# Patient Record
Sex: Female | Born: 1950 | Race: White | Hispanic: No | Marital: Married | Smoking: Never smoker
Health system: Southern US, Community
[De-identification: ages and names within clinical notes are randomized; demographics above are authoritative.]

## PROBLEM LIST (undated history)

## (undated) DIAGNOSIS — R112 Nausea with vomiting, unspecified: Secondary | ICD-10-CM

## (undated) DIAGNOSIS — Z9889 Other specified postprocedural states: Secondary | ICD-10-CM

## (undated) HISTORY — PX: ECTOPIC PREGNANCY SURGERY: SHX613

## (undated) HISTORY — PX: WISDOM TOOTH EXTRACTION: SHX21

## (undated) HISTORY — PX: COLONOSCOPY: SHX174

---

## 2019-05-04 ENCOUNTER — Other Ambulatory Visit: Payer: Self-pay | Admitting: Obstetrics and Gynecology

## 2019-05-04 DIAGNOSIS — R928 Other abnormal and inconclusive findings on diagnostic imaging of breast: Secondary | ICD-10-CM

## 2019-05-16 ENCOUNTER — Other Ambulatory Visit: Payer: Self-pay

## 2019-05-16 ENCOUNTER — Other Ambulatory Visit: Payer: Self-pay | Admitting: Obstetrics and Gynecology

## 2019-05-16 ENCOUNTER — Ambulatory Visit
Admission: RE | Admit: 2019-05-16 | Discharge: 2019-05-16 | Disposition: A | Discharge status: Home / Self Care | Payer: 59 | Source: Ambulatory Visit | Attending: Obstetrics and Gynecology | Admitting: Obstetrics and Gynecology

## 2019-05-16 DIAGNOSIS — R928 Other abnormal and inconclusive findings on diagnostic imaging of breast: Secondary | ICD-10-CM

## 2019-05-19 ENCOUNTER — Ambulatory Visit
Admission: RE | Admit: 2019-05-19 | Discharge: 2019-05-19 | Disposition: A | Discharge status: Home / Self Care | Payer: 59 | Source: Ambulatory Visit | Attending: Obstetrics and Gynecology | Admitting: Obstetrics and Gynecology

## 2019-05-19 ENCOUNTER — Other Ambulatory Visit: Payer: Self-pay

## 2019-05-19 DIAGNOSIS — R928 Other abnormal and inconclusive findings on diagnostic imaging of breast: Secondary | ICD-10-CM

## 2019-05-31 ENCOUNTER — Ambulatory Visit: Payer: Self-pay | Admitting: Surgery

## 2019-05-31 DIAGNOSIS — N6320 Unspecified lump in the left breast, unspecified quadrant: Secondary | ICD-10-CM

## 2019-05-31 NOTE — H&P (Signed)
History of Present Illness Holly Schroeder. Holly Tallman Schroeder; 05/31/2019 5:32 PM) The patient is a 69 year old female who presents with a breast mass. Referred by Dr. Marylynn Schroeder for left breast Mass.  This is a 69 year old female in relatively good health who presents with recent finding on her routine screening mammogram. She had a suspicious finding in the left breast. Further workup showed a 9 x 4 x 8 mm lobular solid cystic mass at 5:00 in the left breast 4 cm from the nipple. The axilla was negative. This mass was biopsied and revealed a finding of complex sclerosing lesion. The patient would like to have this removed.  Menarche age 61 First pregnancy age 65 Breast-feed yes Hormones 3-4 years Menopause age 3 Family history negative   CLINICAL DATA: Patient recalled from screening for left breast mass.  EXAM: DIGITAL DIAGNOSTIC LEFT MAMMOGRAM WITH CAD AND TOMO  ULTRASOUND LEFT BREAST  COMPARISON: Previous exam(s).  ACR Breast Density Category c: The breast tissue is heterogeneously dense, which may obscure small masses.  FINDINGS: Persistent lobular mass within the lower outer left breast demonstrated best on the true lateral and spot compression MLO tomosynthesis images.  Mammographic images were processed with CAD.  Targeted ultrasound is performed, showing a 9 x 4 x 8 mm lobular solid and cystic mass left breast 5 o'clock position 4 cm from nipple corresponding with mammographic abnormality. No left axillary adenopathy.  IMPRESSION: Indeterminate left breast mass 5 o'clock position.  RECOMMENDATION: Ultrasound guided core needle biopsy indeterminate left breast mass 5 o'clock position.  I have discussed the findings and recommendations with the patient. If applicable, a reminder letter will be sent to the patient regarding the next appointment.  BI-RADS CATEGORY 4: Suspicious.   Electronically Signed By: Holly Schroeder M.D. On: 05/16/2019 15:10  CLINICAL  DATA: Patient presents for biopsy of a left breast mass.  EXAM: ULTRASOUND GUIDED LEFT BREAST CORE NEEDLE BIOPSY  COMPARISON: Previous exam(s).  FINDINGS: I met with the patient and we discussed the procedure of ultrasound-guided biopsy, including benefits and alternatives. We discussed the high likelihood of a successful procedure. We discussed the risks of the procedure, including infection, bleeding, tissue injury, clip migration, and inadequate sampling. Informed written consent was given. The usual time-out protocol was performed immediately prior to the procedure.  Lesion quadrant: Lower outer quadrant  Using sterile technique and 1% Lidocaine as local anesthetic, under direct ultrasound visualization, a 14 gauge spring-loaded device was used to perform biopsy of a left breast mass at 5 o'clock using a lateral approach. At the conclusion of the procedure a ribbon tissue marker clip was deployed into the biopsy cavity. Follow up 2 view mammogram was performed and dictated separately.  IMPRESSION: Ultrasound guided biopsy of a left breast mass at 5 o'clock. No apparent complications.  Electronically Signed: By: Holly Schroeder M.D. On: 05/19/2019 13:48   ADDENDUM: Pathology revealed COMPLEX SCLEROSING LESION of the Left breast, 5 o'clock. The biopsy is small epithelial hyperplasia and a focus of architectural atypia. This was found to be concordant by Dr. Audie Schroeder, with excision recommended.  Pathology results were discussed with the patient by telephone. The patient reported doing well after the biopsy with tenderness at the site. Post biopsy instructions and care were reviewed and questions were answered. The patient was encouraged to call [facility] for any additional concerns.  Surgical consultation has been arranged with Dr. Donnie Schroeder at [facility] on May 31, 2019.  Pathology results  reported by  Holly Purser, RN on 05/20/2019.   Electronically Signed By: Holly Schroeder M.D. On: 05/20/2019 13:29    Problem List/Past Medical Holly Schroeder; 05/31/2019 5:32 PM) MASS OF LEFT BREAST ON MAMMOGRAM (N63.20)  Past Surgical History Holly Schroeder, [place]; 05/31/2019 3:01 PM) Breast Biopsy Left. Colon Polyp Removal - Colonoscopy Oral Surgery  Diagnostic Studies History Holly Schroeder, [place]; 05/31/2019 3:01 PM) Colonoscopy 1-5 years ago Mammogram 1-3 years ago  Allergies Holly Schroeder, [place]; 05/31/2019 3:03 PM) No Known Drug Allergies [05/31/2019]: Allergies Reconciled  Medication History Holly Schroeder, CMA; 05/31/2019 3:04 PM) metFORMIN HCl ER (500MG Tablet ER 24HR, Oral) Active. Simvastatin (10MG Tablet, Oral) Active. Prolia (60MG/ML Solution, Subcutaneous) Active. Medications Reconciled  Social History Holly Schroeder, [place]; 05/31/2019 3:01 PM) Alcohol use Moderate alcohol use. Caffeine use Coffee. No drug use Tobacco use Never smoker.  Family History Holly Schroeder, [place]; 05/31/2019 3:01 PM) Alcohol Abuse Daughter, Mother. Depression Mother. Migraine Headache Mother.  Pregnancy / Birth History Holly Schroeder, [place]; 05/31/2019 3:01 PM) Age at menarche 25 years. Age of menopause 50-55 Contraceptive History Oral contraceptives. Length (months) of breastfeeding 7-12 Maternal age 67-30 Para 2  Other Problems Holly Schroeder. Holly Senat, Schroeder; 05/31/2019 5:32 PM) Gastric Ulcer Gastroesophageal Reflux Disease Hypercholesterolemia     Review of Systems Holly Schroeder CMA; 05/31/2019 3:01 PM) General Not Present- Appetite Loss, Chills, Fatigue, Fever, Night Sweats, Weight Gain and Weight Loss. Skin Not Present- Change in Wart/Mole, Dryness, Hives, Jaundice, New Lesions, Non-Healing Wounds, Rash and Ulcer. HEENT Present- Nose Bleed. Not Present- Earache, Hearing Loss, Hoarseness, Oral Ulcers, Ringing in the Ears, Seasonal Allergies, Sinus  Pain, Sore Throat, Visual Disturbances, Wears glasses/contact lenses and Yellow Eyes. Respiratory Not Present- Bloody sputum, Chronic Cough, Difficulty Breathing, Snoring and Wheezing. Breast Present- Breast Mass. Not Present- Breast Pain, Nipple Discharge and Skin Changes. Cardiovascular Not Present- Chest Pain, Difficulty Breathing Lying Down, Leg Cramps, Palpitations, Rapid Heart Rate, Shortness of Breath and Swelling of Extremities. Gastrointestinal Present- Hemorrhoids. Not Present- Abdominal Pain, Bloating, Bloody Stool, Change in Bowel Habits, Chronic diarrhea, Constipation, Difficulty Swallowing, Excessive gas, Gets full quickly at meals, Indigestion, Nausea, Rectal Pain and Vomiting. Female Genitourinary Not Present- Frequency, Nocturia, Painful Urination, Pelvic Pain and Urgency. Musculoskeletal Not Present- Back Pain, Joint Pain, Joint Stiffness, Muscle Pain, Muscle Weakness and Swelling of Extremities. Neurological Not Present- Decreased Memory, Fainting, Headaches, Numbness, Seizures, Tingling, Tremor, Trouble walking and Weakness. Psychiatric Not Present- Anxiety, Bipolar, Change in Sleep Pattern, Depression, Fearful and Frequent crying. Hematology Not Present- Blood Thinners, Easy Bruising, Excessive bleeding, Gland problems, HIV and Persistent Infections.  Vitals Holly Schroeder CMA; 05/31/2019 3:03 PM) 05/31/2019 3:03 PM Weight: 131.8 lb Height: 60in Body Surface Area: 1.56 m Body Mass Index: 25.74 kg/m  Temp.: 59F  Pulse: 96 (Regular)  BP: 130/82 (Sitting, Left Arm, Standard)        Physical Exam Holly Key K. Brayleigh Rybacki Schroeder; 05/31/2019 5:33 PM)  The physical exam findings are as follows: Note:WDWN in NAD Eyes: Pupils equal, round; sclera anicteric HENT: Oral mucosa moist; good dentition Neck: No masses palpated, no thyromegaly Lungs: CTA bilaterally; normal respiratory effort Breasts: symmetric; no nipple retraction or discharge, no axillary lymphadenopathy;  no palpable masses in either breast CV: Regular rate and rhythm; no murmurs; extremities well-perfused with no edema Abd: +bowel sounds, soft, non-tender, no palpable organomegaly; no palpable hernias Skin: Warm, dry; no sign of jaundice Psychiatric - alert and oriented x 4; calm mood and affect    Assessment & Plan Holly Key K. Janille Draughon Schroeder;  05/31/2019 5:33 PM)  MASS OF LEFT BREAST ON MAMMOGRAM (N63.20) Impression: Left breast 0500 4 cmfn - 9 x 4 x 8 mm complex sclerosing lesion with a focus of atypia  Current Plans Schedule for Surgery - Left radioactive seed localized lumpectomy. The surgical procedure has been discussed with the patient. Potential risks, benefits, alternative treatments, and expected outcomes have been explained. All of the patient's questions at this time have been answered. The likelihood of reaching the patient's treatment goal is good. The patient understand the proposed surgical procedure and wishes to proceed.  Holly Schroeder. Georgette Dover, Schroeder, [facility]  General/ Trauma Surgery   05/31/2019 5:34 PM

## 2019-06-02 ENCOUNTER — Other Ambulatory Visit: Payer: Self-pay | Admitting: Surgery

## 2019-06-02 DIAGNOSIS — N6320 Unspecified lump in the left breast, unspecified quadrant: Secondary | ICD-10-CM

## 2019-07-04 ENCOUNTER — Other Ambulatory Visit: Payer: Self-pay

## 2019-07-04 ENCOUNTER — Encounter (HOSPITAL_BASED_OUTPATIENT_CLINIC_OR_DEPARTMENT_OTHER): Payer: Self-pay | Admitting: Surgery

## 2019-07-07 ENCOUNTER — Other Ambulatory Visit: Payer: Self-pay

## 2019-07-07 ENCOUNTER — Encounter (HOSPITAL_BASED_OUTPATIENT_CLINIC_OR_DEPARTMENT_OTHER)
Admission: RE | Admit: 2019-07-07 | Discharge: 2019-07-07 | Disposition: A | Discharge status: Home / Self Care | Payer: Medicare Other | Source: Ambulatory Visit | Attending: Surgery | Admitting: Surgery

## 2019-07-07 DIAGNOSIS — Z79899 Other long term (current) drug therapy: Secondary | ICD-10-CM | POA: Diagnosis not present

## 2019-07-07 DIAGNOSIS — Z01812 Encounter for preprocedural laboratory examination: Secondary | ICD-10-CM | POA: Insufficient documentation

## 2019-07-07 DIAGNOSIS — Z7984 Long term (current) use of oral hypoglycemic drugs: Secondary | ICD-10-CM | POA: Diagnosis not present

## 2019-07-07 DIAGNOSIS — E7800 Pure hypercholesterolemia, unspecified: Secondary | ICD-10-CM | POA: Diagnosis not present

## 2019-07-07 DIAGNOSIS — N6082 Other benign mammary dysplasias of left breast: Secondary | ICD-10-CM | POA: Diagnosis present

## 2019-07-07 LAB — BASIC METABOLIC PANEL
Anion gap: 9 (ref 5–15)
BUN: 13 mg/dL (ref 8–23)
CO2: 26 mmol/L (ref 22–32)
Calcium: 9.5 mg/dL (ref 8.9–10.3)
Chloride: 101 mmol/L (ref 98–111)
Creatinine, Ser: 0.78 mg/dL (ref 0.44–1.00)
GFR calc Af Amer: >60 mL/min (ref >60)
GFR calc non Af Amer: >60 mL/min (ref >60)
Glucose, Bld: 98 mg/dL (ref 70–99)
Potassium: 4.4 mmol/L (ref 3.5–5.1)
Sodium: 136 mmol/L (ref 135–145)

## 2019-07-07 MED ORDER — ENSURE PRE-SURGERY PO LIQD: 296.0000 mL | Freq: Once | ORAL | Status: DC

## 2019-07-07 NOTE — Progress Notes (Signed)

## 2019-07-08 ENCOUNTER — Other Ambulatory Visit (HOSPITAL_COMMUNITY)
Admission: RE | Admit: 2019-07-08 | Discharge: 2019-07-08 | Disposition: A | Discharge status: Home / Self Care | Payer: Medicare Other | Source: Ambulatory Visit | Attending: Surgery | Admitting: Surgery

## 2019-07-08 DIAGNOSIS — Z01812 Encounter for preprocedural laboratory examination: Secondary | ICD-10-CM | POA: Diagnosis present

## 2019-07-08 DIAGNOSIS — Z20822 Contact with and (suspected) exposure to covid-19: Secondary | ICD-10-CM | POA: Insufficient documentation

## 2019-07-08 LAB — SARS CORONAVIRUS 2 (TAT 6-24 HRS): SARS Coronavirus 2: NEGATIVE

## 2019-07-11 ENCOUNTER — Other Ambulatory Visit: Payer: Self-pay

## 2019-07-11 ENCOUNTER — Ambulatory Visit
Admission: RE | Admit: 2019-07-11 | Discharge: 2019-07-11 | Disposition: A | Discharge status: Home / Self Care | Payer: Medicare Other | Source: Ambulatory Visit | Attending: Surgery | Admitting: Surgery

## 2019-07-11 NOTE — Anesthesia Preprocedure Evaluation (Addendum)
Anesthesia Evaluation  Patient identified by MRN, date of birth, ID band Patient awake    Reviewed: Allergy & Precautions, NPO status , Patient's Chart, lab work & pertinent test results  History of Anesthesia Complications (+) PONV  Airway Mallampati: Trach       Dental   Pulmonary    breath sounds clear to auscultation       Cardiovascular negative cardio ROS   Rhythm:Regular Rate:Normal     Neuro/Psych    GI/Hepatic negative GI ROS, Neg liver ROS,   Endo/Other    Renal/GU negative Renal ROS     Musculoskeletal   Abdominal   Peds  Hematology   Anesthesia Other Findings   Reproductive/Obstetrics                            Anesthesia Physical Anesthesia Plan  ASA: II  Anesthesia Plan: General   Post-op Pain Management:    Induction: Intravenous  PONV Risk Score and Plan: 3 and Ondansetron, Dexamethasone and Midazolam  Airway Management Planned: LMA  Additional Equipment:   Intra-op Plan:   Post-operative Plan:   Informed Consent: I have reviewed the patients History and Physical, chart, labs and discussed the procedure including the risks, benefits and alternatives for the proposed anesthesia with the patient or authorized representative who has indicated his/her understanding and acceptance.     Dental advisory given  Plan Discussed with: Anesthesiologist and CRNA  Anesthesia Plan Comments:        Anesthesia Quick Evaluation

## 2019-07-12 ENCOUNTER — Encounter (HOSPITAL_BASED_OUTPATIENT_CLINIC_OR_DEPARTMENT_OTHER): Payer: Self-pay | Admitting: Surgery

## 2019-07-12 ENCOUNTER — Other Ambulatory Visit: Payer: Self-pay

## 2019-07-12 ENCOUNTER — Ambulatory Visit
Admission: RE | Admit: 2019-07-12 | Discharge: 2019-07-12 | Disposition: A | Discharge status: Home / Self Care | Payer: Medicare Other | Source: Ambulatory Visit | Attending: Surgery | Admitting: Surgery

## 2019-07-12 ENCOUNTER — Ambulatory Visit (HOSPITAL_BASED_OUTPATIENT_CLINIC_OR_DEPARTMENT_OTHER): Payer: Medicare Other | Admitting: Anesthesiology

## 2019-07-12 ENCOUNTER — Encounter (HOSPITAL_BASED_OUTPATIENT_CLINIC_OR_DEPARTMENT_OTHER)
Admission: RE | Disposition: A | Discharge status: Home / Self Care | Payer: Self-pay | Source: Home / Self Care | Attending: Surgery

## 2019-07-12 ENCOUNTER — Ambulatory Visit (HOSPITAL_BASED_OUTPATIENT_CLINIC_OR_DEPARTMENT_OTHER)
Admission: RE | Admit: 2019-07-12 | Discharge: 2019-07-12 | Disposition: A | Discharge status: Home / Self Care | Payer: Medicare Other | Attending: Surgery | Admitting: Surgery

## 2019-07-12 DIAGNOSIS — Z79899 Other long term (current) drug therapy: Secondary | ICD-10-CM | POA: Diagnosis not present

## 2019-07-12 DIAGNOSIS — E7800 Pure hypercholesterolemia, unspecified: Secondary | ICD-10-CM | POA: Diagnosis not present

## 2019-07-12 DIAGNOSIS — N6082 Other benign mammary dysplasias of left breast: Secondary | ICD-10-CM | POA: Diagnosis not present

## 2019-07-12 DIAGNOSIS — Z7984 Long term (current) use of oral hypoglycemic drugs: Secondary | ICD-10-CM | POA: Insufficient documentation

## 2019-07-12 DIAGNOSIS — N6320 Unspecified lump in the left breast, unspecified quadrant: Secondary | ICD-10-CM

## 2019-07-12 HISTORY — DX: Other specified postprocedural states: R11.2

## 2019-07-12 HISTORY — PX: BREAST LUMPECTOMY WITH RADIOACTIVE SEED LOCALIZATION: SHX6424

## 2019-07-12 HISTORY — DX: Other specified postprocedural states: Z98.890

## 2019-07-12 LAB — GLUCOSE, CAPILLARY
Glucose-Capillary: 89 mg/dL (ref 70–99)
Glucose-Capillary: 99 mg/dL (ref 70–99)

## 2019-07-12 SURGERY — BREAST LUMPECTOMY WITH RADIOACTIVE SEED LOCALIZATION
Anesthesia: General | Site: Breast | Laterality: Left

## 2019-07-12 MED ORDER — FENTANYL CITRATE (PF) 100 MCG/2ML IJ SOLN: 25.0000 ug | INTRAMUSCULAR | Status: DC | PRN

## 2019-07-12 MED ORDER — HYDROCODONE-ACETAMINOPHEN 5-325 MG PO TABS: 1.0000 | ORAL_TABLET | Freq: Four times a day (QID) | ORAL | 0 refills | Status: DC | PRN

## 2019-07-12 MED ORDER — DEXAMETHASONE SODIUM PHOSPHATE 10 MG/ML IJ SOLN
INTRAMUSCULAR | Status: AC
  Filled 2019-07-12: qty 1

## 2019-07-12 MED ORDER — CHLORHEXIDINE GLUCONATE CLOTH 2 % EX PADS: 6.0000 | MEDICATED_PAD | Freq: Once | CUTANEOUS | Status: DC

## 2019-07-12 MED ORDER — PROPOFOL 500 MG/50ML IV EMUL
INTRAVENOUS | Status: AC
  Filled 2019-07-12: qty 50

## 2019-07-12 MED ORDER — MIDAZOLAM HCL 2 MG/2ML IJ SOLN
1.0000 mg | INTRAMUSCULAR | Status: DC | PRN
  Administered 2019-07-12: 1 mg via INTRAVENOUS

## 2019-07-12 MED ORDER — GABAPENTIN 300 MG PO CAPS
ORAL_CAPSULE | ORAL | Status: AC
  Filled 2019-07-12: qty 1

## 2019-07-12 MED ORDER — GABAPENTIN 300 MG PO CAPS
300.0000 mg | ORAL_CAPSULE | ORAL | Status: AC
  Administered 2019-07-12: 300 mg via ORAL

## 2019-07-12 MED ORDER — ONDANSETRON HCL 4 MG/2ML IJ SOLN
INTRAMUSCULAR | Status: DC | PRN
  Administered 2019-07-12: 4 mg via INTRAVENOUS

## 2019-07-12 MED ORDER — LIDOCAINE 2% (20 MG/ML) 5 ML SYRINGE
INTRAMUSCULAR | Status: DC | PRN
  Administered 2019-07-12: 80 mg via INTRAVENOUS

## 2019-07-12 MED ORDER — CEFAZOLIN SODIUM-DEXTROSE 2-4 GM/100ML-% IV SOLN
INTRAVENOUS | Status: AC
  Filled 2019-07-12: qty 100

## 2019-07-12 MED ORDER — LACTATED RINGERS IV SOLN: INTRAVENOUS | Status: DC

## 2019-07-12 MED ORDER — PROPOFOL 10 MG/ML IV BOLUS
INTRAVENOUS | Status: DC | PRN
  Administered 2019-07-12: 180 mg via INTRAVENOUS

## 2019-07-12 MED ORDER — BUPIVACAINE HCL 0.25 % IJ SOLN
INTRAMUSCULAR | Status: DC | PRN
  Administered 2019-07-12: 10 mL

## 2019-07-12 MED ORDER — DEXAMETHASONE SODIUM PHOSPHATE 10 MG/ML IJ SOLN: 4.0000 mg | INTRAMUSCULAR | Status: DC

## 2019-07-12 MED ORDER — FENTANYL CITRATE (PF) 100 MCG/2ML IJ SOLN
50.0000 ug | INTRAMUSCULAR | Status: DC | PRN
  Administered 2019-07-12: 50 ug via INTRAVENOUS

## 2019-07-12 MED ORDER — BUPIVACAINE HCL (PF) 0.25 % IJ SOLN
INTRAMUSCULAR | Status: AC
  Filled 2019-07-12: qty 60

## 2019-07-12 MED ORDER — EPHEDRINE SULFATE 50 MG/ML IJ SOLN
INTRAMUSCULAR | Status: DC | PRN
  Administered 2019-07-12 (×2): 10 mg via INTRAVENOUS

## 2019-07-12 MED ORDER — BUPIVACAINE HCL (PF) 0.5 % IJ SOLN
INTRAMUSCULAR | Status: AC
  Filled 2019-07-12: qty 30

## 2019-07-12 MED ORDER — FENTANYL CITRATE (PF) 100 MCG/2ML IJ SOLN
INTRAMUSCULAR | Status: AC
  Filled 2019-07-12: qty 2

## 2019-07-12 MED ORDER — MIDAZOLAM HCL 2 MG/2ML IJ SOLN
INTRAMUSCULAR | Status: AC
  Filled 2019-07-12: qty 2

## 2019-07-12 MED ORDER — ONDANSETRON HCL 4 MG/2ML IJ SOLN
INTRAMUSCULAR | Status: AC
  Filled 2019-07-12: qty 2

## 2019-07-12 MED ORDER — ACETAMINOPHEN 500 MG PO TABS
ORAL_TABLET | ORAL | Status: AC
  Filled 2019-07-12: qty 2

## 2019-07-12 MED ORDER — DEXAMETHASONE SODIUM PHOSPHATE 4 MG/ML IJ SOLN
INTRAMUSCULAR | Status: DC | PRN
  Administered 2019-07-12: 5 mg via INTRAVENOUS

## 2019-07-12 MED ORDER — CEFAZOLIN SODIUM-DEXTROSE 2-4 GM/100ML-% IV SOLN
2.0000 g | INTRAVENOUS | Status: AC
  Administered 2019-07-12: 2 g via INTRAVENOUS

## 2019-07-12 MED ORDER — ACETAMINOPHEN 500 MG PO TABS
1000.0000 mg | ORAL_TABLET | ORAL | Status: AC
  Administered 2019-07-12: 1000 mg via ORAL

## 2019-07-12 SURGICAL SUPPLY — 54 items
APL PRP STRL LF DISP 70% ISPRP (MISCELLANEOUS) ×1
APL SKNCLS STERI-STRIP NONHPOA (GAUZE/BANDAGES/DRESSINGS) ×1
APPLIER CLIP 9.375 MED OPEN (MISCELLANEOUS) ×2
APR CLP MED 9.3 20 MLT OPN (MISCELLANEOUS) ×1
BENZOIN TINCTURE PRP APPL 2/3 (GAUZE/BANDAGES/DRESSINGS) ×2 IMPLANT
BLADE HEX COATED 2.75 (ELECTRODE) ×2 IMPLANT
BLADE SURG 15 STRL LF DISP TIS (BLADE) ×1 IMPLANT
BLADE SURG 15 STRL SS (BLADE) ×2
CANISTER SUC SOCK COL 7IN (MISCELLANEOUS) IMPLANT
CANISTER SUCT 1200ML W/VALVE (MISCELLANEOUS) IMPLANT
CHLORAPREP W/TINT 26 (MISCELLANEOUS) ×2 IMPLANT
CLIP APPLIE 9.375 MED OPEN (MISCELLANEOUS) ×1 IMPLANT
COVER BACK TABLE 60X90IN (DRAPES) ×2 IMPLANT
COVER MAYO STAND STRL (DRAPES) ×2 IMPLANT
COVER PROBE W GEL 5X96 (DRAPES) ×2 IMPLANT
COVER WAND RF STERILE (DRAPES) IMPLANT
DECANTER SPIKE VIAL GLASS SM (MISCELLANEOUS) IMPLANT
DRAPE LAPAROTOMY 100X72 PEDS (DRAPES) ×2 IMPLANT
DRAPE UTILITY XL STRL (DRAPES) ×2 IMPLANT
DRSG TEGADERM 4X4.75 (GAUZE/BANDAGES/DRESSINGS) ×2 IMPLANT
ELECT REM PT RETURN 9FT ADLT (ELECTROSURGICAL) ×2
ELECTRODE REM PT RTRN 9FT ADLT (ELECTROSURGICAL) ×1 IMPLANT
GAUZE SPONGE 4X4 12PLY STRL LF (GAUZE/BANDAGES/DRESSINGS) IMPLANT
GLOVE BIO SURGEON STRL SZ 6.5 (GLOVE) ×1 IMPLANT
GLOVE BIO SURGEON STRL SZ7 (GLOVE) ×2 IMPLANT
GLOVE BIOGEL PI IND STRL 7.0 (GLOVE) IMPLANT
GLOVE BIOGEL PI IND STRL 7.5 (GLOVE) ×1 IMPLANT
GLOVE BIOGEL PI INDICATOR 7.0 (GLOVE) ×2
GLOVE BIOGEL PI INDICATOR 7.5 (GLOVE) ×1
GOWN STRL REUS W/ TWL LRG LVL3 (GOWN DISPOSABLE) ×2 IMPLANT
GOWN STRL REUS W/TWL LRG LVL3 (GOWN DISPOSABLE) ×4
ILLUMINATOR WAVEGUIDE N/F (MISCELLANEOUS) IMPLANT
KIT MARKER MARGIN INK (KITS) ×2 IMPLANT
LIGHT WAVEGUIDE WIDE FLAT (MISCELLANEOUS) IMPLANT
NDL HYPO 25X1 1.5 SAFETY (NEEDLE) ×1 IMPLANT
NEEDLE HYPO 25X1 1.5 SAFETY (NEEDLE) ×2 IMPLANT
NS IRRIG 1000ML POUR BTL (IV SOLUTION) ×2 IMPLANT
PACK BASIN DAY SURGERY FS (CUSTOM PROCEDURE TRAY) ×2 IMPLANT
PENCIL SMOKE EVACUATOR (MISCELLANEOUS) ×2 IMPLANT
SLEEVE SCD COMPRESS KNEE MED (MISCELLANEOUS) ×2 IMPLANT
SPONGE GAUZE 2X2 8PLY STRL LF (GAUZE/BANDAGES/DRESSINGS) ×1 IMPLANT
SPONGE LAP 18X18 RF (DISPOSABLE) IMPLANT
SPONGE LAP 4X18 RFD (DISPOSABLE) ×2 IMPLANT
STRIP CLOSURE SKIN 1/2X4 (GAUZE/BANDAGES/DRESSINGS) ×2 IMPLANT
SUT MON AB 4-0 PC3 18 (SUTURE) ×2 IMPLANT
SUT SILK 2 0 SH (SUTURE) IMPLANT
SUT VIC AB 3-0 SH 27 (SUTURE) ×2
SUT VIC AB 3-0 SH 27X BRD (SUTURE) ×1 IMPLANT
SYR BULB 3OZ (MISCELLANEOUS) IMPLANT
SYR CONTROL 10ML LL (SYRINGE) ×2 IMPLANT
TOWEL GREEN STERILE FF (TOWEL DISPOSABLE) ×2 IMPLANT
TRAY FAXITRON CT DISP (TRAY / TRAY PROCEDURE) ×2 IMPLANT
TUBE CONNECTING 20X1/4 (TUBING) IMPLANT
YANKAUER SUCT BULB TIP NO VENT (SUCTIONS) IMPLANT

## 2019-07-12 NOTE — Discharge Instructions (Signed)
[place] Office Phone Number [phone]  BREAST BIOPSY/ PARTIAL MASTECTOMY: POST OP INSTRUCTIONS  Always review your discharge instruction sheet given to you by the facility where your surgery was performed.  IF YOU HAVE DISABILITY OR FAMILY LEAVE FORMS, YOU MUST BRING THEM TO THE OFFICE FOR PROCESSING.  DO NOT GIVE THEM TO YOUR DOCTOR.  1. A prescription for pain medication may be given to you upon discharge.  Take your pain medication as prescribed, if needed.  If narcotic pain medicine is not needed, then you may take acetaminophen (Tylenol) or ibuprofen (Advil) as needed. 2. Take your usually prescribed medications unless otherwise directed 3. If you need a refill on your pain medication, please contact your pharmacy.  They will contact our office to request authorization.  Prescriptions will not be filled after 5pm or on week-ends. 4. You should eat very light the first 24 hours after surgery, such as soup, crackers, pudding, etc.  Resume your normal diet the day after surgery. 5. Most patients will experience some swelling and bruising in the breast.  Ice packs and a good support bra will help.  Swelling and bruising can take several days to resolve.  6. It is common to experience some constipation if taking pain medication after surgery.  Increasing fluid intake and taking a stool softener will usually help or prevent this problem from occurring.  A mild laxative (Milk of Magnesia or Miralax) should be taken according to package directions if there are no bowel movements after 48 hours. 7. Unless discharge instructions indicate otherwise, you may remove your bandages 48 hours after surgery, and you may shower at that time.  You will have steri-strips (small skin tapes) in place directly over the incision.  These strips should be left on the skin for 7-10 days.   Any sutures or staples will be removed at the office during your follow-up visit. 8. ACTIVITIES:  You may resume  regular daily activities (gradually increasing) beginning the next day.  Wearing a good support bra or sports bra minimizes pain and swelling.  You may have sexual intercourse when it is comfortable. a. You may drive when you no longer are taking prescription pain medication, you can comfortably wear a seatbelt, and you can safely maneuver your car and apply brakes. b. RETURN TO WORK:  1-2 weeks 9. You should see your doctor in the office for a follow-up appointment approximately two weeks after your surgery.  Your doctor's nurse will typically make your follow-up appointment when she calls you with your pathology report.  Expect your pathology report 2-3 business days after your surgery.  You may call to check if you do not hear from [place] after three days. 10. OTHER INSTRUCTIONS: _______________________________________________________________________________________________ _____________________________________________________________________________________________________________________________________ _____________________________________________________________________________________________________________________________________ _____________________________________________________________________________________________________________________________________  WHEN TO CALL YOUR DOCTOR: 1. Fever over 101.0 2. Nausea and/or vomiting. 3. Extreme swelling or bruising. 4. Continued bleeding from incision. 5. Increased pain, redness, or drainage from the incision.  The clinic staff is available to answer your questions during regular business hours.  Please don't hesitate to call and ask to speak to one of the nurses for clinical concerns.  If you have a medical emergency, go to the nearest emergency room or call 911.  A surgeon from [facility] is always on call at the hospital.  For further questions, please visit [email or web address]  \\    NO TYLENOL PRODUCTS UNTIL 1:00  PM     Post Anesthesia Home Care Instructions  Activity: Get plenty of rest  for the remainder of the day. A responsible individual must stay with you for 24 hours following the procedure.  For the next 24 hours, DO NOT: -Drive a car -Advertising copywriter -Drink alcoholic beverages -Take any medication unless instructed by your physician -Make any legal decisions or sign important papers.  Meals: Start with liquid foods such as gelatin or soup. Progress to regular foods as tolerated. Avoid greasy, spicy, heavy foods. If nausea and/or vomiting occur, drink only clear liquids until the nausea and/or vomiting subsides. Call your physician if vomiting continues.  Special Instructions/Symptoms: Your throat may feel dry or sore from the anesthesia or the breathing tube placed in your throat during surgery. If this causes discomfort, gargle with warm salt water. The discomfort should disappear within 24 hours.  If you had a scopolamine patch placed behind your ear for the management of post- operative nausea and/or vomiting:  1. The medication in the patch is effective for 72 hours, after which it should be removed.  Wrap patch in a tissue and discard in the trash. Wash hands thoroughly with soap and water. 2. You may remove the patch earlier than 72 hours if you experience unpleasant side effects which may include dry mouth, dizziness or visual disturbances. 3. Avoid touching the patch. Wash your hands with soap and water after contact with the patch.

## 2019-07-12 NOTE — Anesthesia Postprocedure Evaluation (Signed)
Anesthesia Post Note  Patient: Transport planner  Procedure(s) Performed: LEFT BREAST LUMPECTOMY WITH RADIOACTIVE SEED LOCALIZATION (Left Breast)     Patient location during evaluation: PACU Anesthesia Type: General Level of consciousness: awake Pain management: pain level controlled Vital Signs Assessment: post-procedure vital signs reviewed and stable Respiratory status: spontaneous breathing Cardiovascular status: stable Postop Assessment: no apparent nausea or vomiting Anesthetic complications: no    Last Vitals:  Vitals:   07/12/19 0850 07/12/19 0930  BP:  136/77  Pulse: 86 82  Resp: 14 16  Temp:  36.5 C  SpO2: 99% 100%    Last Pain:  Vitals:   07/12/19 0930  TempSrc:   PainSc: 2                  Alban Marucci

## 2019-07-12 NOTE — Op Note (Signed)
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 Anesthesia:  GEN - LMA Indications:  This is a 69 year old female in relatively good health who presents with recent finding on her routine screening mammogram. She had a suspicious finding in the left breast. Further workup showed a 9 x 4 x 8 mm lobular solid cystic mass at 5:00 in the left breast 4 cm from the nipple. The axilla was negative. This mass was biopsied and revealed a finding of complex sclerosing lesion.  Description of procedure: The patient is brought to the operating room placed in supine position on the operating room table. After an adequate level of general anesthesia was obtained, her left breast was prepped with ChloraPrep and draped in sterile fashion. A timeout was taken to ensure the proper patient and proper procedure. We interrogated the breast with the neoprobe. We made a circumareolar incision around the lower side of the nipple after infiltrating with 0.25% Marcaine. Dissection was carried down in the breast tissue with cautery. We used the neoprobe to guide [place] towards the radioactive seed. We excised an area of tissue around the radioactive seed 1.5 cm in diameter. The specimen was removed and was oriented with a paint kit. Specimen mammogram showed the radioactive seed as well as the biopsy clip within the specimen. This was sent for pathologic examination. There is no residual radioactivity within the biopsy cavity. We inspected carefully for hemostasis. The wound was thoroughly irrigated. We placed clips to mark the margins of the lumpectomy cavity. The wound was closed with a deep layer of 3-0 Vicryl and a subcuticular layer of 4-0 Monocryl. Benzoin Steri-Strips were applied. The patient was then extubated and brought to the recovery room in stable condition. All sponge, instrument, and  needle counts are correct.  Holly Schroeder. Holly Dover, MD, [facility]  General/ Trauma Surgery  07/12/2019 8:16 AM

## 2019-07-12 NOTE — Anesthesia Procedure Notes (Signed)
Procedure Name: LMA Insertion Date/Time: 07/12/2019 7:29 AM Performed by: Burna Cash, CRNA Pre-anesthesia Checklist: Patient identified, Emergency Drugs available, Suction available and Patient being monitored Patient Re-evaluated:Patient Re-evaluated prior to induction Oxygen Delivery Method: Circle system utilized Preoxygenation: Pre-oxygenation with 100% oxygen Induction Type: IV induction Ventilation: Mask ventilation without difficulty LMA: LMA inserted LMA Size: 4.0 Number of attempts: 1 Airway Equipment and Method: Bite block Placement Confirmation: positive ETCO2 Tube secured with: Tape Dental Injury: Teeth and Oropharynx as per pre-operative assessment

## 2019-07-12 NOTE — H&P (Signed)
History of Present Illness  The patient is a 69 year old female who presents with a breast mass. Referred by Dr. Marylynn Pearson for left breast Mass.  This is a 69 year old female in relatively good health who presents with recent finding on her routine screening mammogram. She had a suspicious finding in the left breast. Further workup showed a 9 x 4 x 8 mm lobular solid cystic mass at 5:00 in the left breast 4 cm from the nipple. The axilla was negative. This mass was biopsied and revealed a finding of complex sclerosing lesion. The patient would like to have this removed.  Menarche age 39 First pregnancy age 48 Breast-feed yes Hormones 3-4 years Menopause age 38 Family history negative   CLINICAL DATA: Patient recalled from screening for left breast mass.  EXAM: DIGITAL DIAGNOSTIC LEFT MAMMOGRAM WITH CAD AND TOMO  ULTRASOUND LEFT BREAST  COMPARISON: Previous exam(s).  ACR Breast Density Category c: The breast tissue is heterogeneously dense, which may obscure small masses.  FINDINGS: Persistent lobular mass within the lower outer left breast demonstrated best on the true lateral and spot compression MLO tomosynthesis images.  Mammographic images were processed with CAD.  Targeted ultrasound is performed, showing a 9 x 4 x 8 mm lobular solid and cystic mass left breast 5 o'clock position 4 cm from nipple corresponding with mammographic abnormality. No left axillary adenopathy.  IMPRESSION: Indeterminate left breast mass 5 o'clock position.  RECOMMENDATION: Ultrasound guided core needle biopsy indeterminate left breast mass 5 o'clock position.  I have discussed the findings and recommendations with the patient. If applicable, a reminder letter will be sent to the patient regarding the next appointment.  BI-RADS CATEGORY 4: Suspicious.   Electronically Signed By: Lovey Newcomer M.D. On: 05/16/2019 15:10  CLINICAL DATA: Patient  presents for biopsy of a left breast mass.  EXAM: ULTRASOUND GUIDED LEFT BREAST CORE NEEDLE BIOPSY  COMPARISON: Previous exam(s).  FINDINGS: I met with the patient and we discussed the procedure of ultrasound-guided biopsy, including benefits and alternatives. We discussed the high likelihood of a successful procedure. We discussed the risks of the procedure, including infection, bleeding, tissue injury, clip migration, and inadequate sampling. Informed written consent was given. The usual time-out protocol was performed immediately prior to the procedure.  Lesion quadrant: Lower outer quadrant  Using sterile technique and 1% Lidocaine as local anesthetic, under direct ultrasound visualization, a 14 gauge spring-loaded device was used to perform biopsy of a left breast mass at 5 o'clock using a lateral approach. At the conclusion of the procedure a ribbon tissue marker clip was deployed into the biopsy cavity. Follow up 2 view mammogram was performed and dictated separately.  IMPRESSION: Ultrasound guided biopsy of a left breast mass at 5 o'clock. No apparent complications.  Electronically Signed: By: Audie Pinto M.D. On: 05/19/2019 13:48   ADDENDUM: Pathology revealed COMPLEX SCLEROSING LESION of the Left breast, 5 o'clock. The biopsy is small epithelial hyperplasia and a focus of architectural atypia. This was found to be concordant by Dr. Audie Pinto, with excision recommended.  Pathology results were discussed with the patient by telephone. The patient reported doing well after the biopsy with tenderness at the site. Post biopsy instructions and care were reviewed and questions were answered. The patient was encouraged to call [facility] for any additional concerns.  Surgical consultation has been arranged with Dr. Donnie Mesa at [facility] on May 31, 2019.  Pathology results reported by  Terie Purser, RN  on 05/20/2019.   Electronically Signed By: Audie Pinto M.D. On: 05/20/2019 13:29    Problem List/Past Medical  MASS OF LEFT BREAST ON MAMMOGRAM (N63.20)  Past Surgical History  Breast Biopsy Left. Colon Polyp Removal - Colonoscopy Oral Surgery  Diagnostic Studies History Colonoscopy 1-5 years ago Mammogram 1-3 years ago  Allergies  No Known Drug Allergies  Allergies Reconciled  Medication History  metFORMIN HCl ER ('500MG'$  Tablet ER 24HR, Oral) Active. Simvastatin ('10MG'$  Tablet, Oral) Active. Prolia ('60MG'$ /ML Solution, Subcutaneous) Active. Medications Reconciled  Social History  Alcohol use Moderate alcohol use. Caffeine use Coffee. No drug use Tobacco use Never smoker.  Family History  Alcohol Abuse Daughter, Mother. Depression Mother. Migraine Headache Mother.  Pregnancy / Birth History  Age at menarche 2 years. Age of menopause 51-55 Contraceptive History Oral contraceptives. Length (months) of breastfeeding 7-12 Maternal age 56-30 Para 2  Other Problems  Gastric Ulcer Gastroesophageal Reflux Disease Hypercholesterolemia   Review of Systems  General Not Present- Appetite Loss, Chills, Fatigue, Fever, Night Sweats, Weight Gain and Weight Loss. Skin Not Present- Change in Wart/Mole, Dryness, Hives, Jaundice, New Lesions, Non-Healing Wounds, Rash and Ulcer. HEENT Present- Nose Bleed. Not Present- Earache, Hearing Loss, Hoarseness, Oral Ulcers, Ringing in the Ears, Seasonal Allergies, Sinus Pain, Sore Throat, Visual Disturbances, Wears glasses/contact lenses and Yellow Eyes. Respiratory Not Present- Bloody sputum, Chronic Cough, Difficulty Breathing, Snoring and Wheezing. Breast Present- Breast Mass. Not Present- Breast Pain, Nipple Discharge and Skin Changes. Cardiovascular Not Present- Chest Pain, Difficulty Breathing Lying Down, Leg Cramps, Palpitations, Rapid Heart Rate, Shortness of Breath  and Swelling of Extremities. Gastrointestinal Present- Hemorrhoids. Not Present- Abdominal Pain, Bloating, Bloody Stool, Change in Bowel Habits, Chronic diarrhea, Constipation, Difficulty Swallowing, Excessive gas, Gets full quickly at meals, Indigestion, Nausea, Rectal Pain and Vomiting. Female Genitourinary Not Present- Frequency, Nocturia, Painful Urination, Pelvic Pain and Urgency. Musculoskeletal Not Present- Back Pain, Joint Pain, Joint Stiffness, Muscle Pain, Muscle Weakness and Swelling of Extremities. Neurological Not Present- Decreased Memory, Fainting, Headaches, Numbness, Seizures, Tingling, Tremor, Trouble walking and Weakness. Psychiatric Not Present- Anxiety, Bipolar, Change in Sleep Pattern, Depression, Fearful and Frequent crying. Hematology Not Present- Blood Thinners, Easy Bruising, Excessive bleeding, Gland problems, HIV and Persistent Infections.  Vitals Weight: 131.8 lb Height: 60in Body Surface Area: 1.56 m Body Mass Index: 25.74 kg/m  Temp.: 76F  Pulse: 96 (Regular)  BP: 130/82 (Sitting, Left Arm, Standard)   The physical exam findings are as follows: Note:WDWN in NAD Eyes: Pupils equal, round; sclera anicteric HENT: Oral mucosa moist; good dentition Neck: No masses palpated, no thyromegaly Lungs: CTA bilaterally; normal respiratory effort Breasts: symmetric; no nipple retraction or discharge, no axillary lymphadenopathy; no palpable masses in either breast CV: Regular rate and rhythm; no murmurs; extremities well-perfused with no edema Abd: +bowel sounds, soft, non-tender, no palpable organomegaly; no palpable hernias Skin: Warm, dry; no sign of jaundice Psychiatric - alert and oriented x 4; calm mood and affect    Assessment & Plan  MASS OF LEFT BREAST ON MAMMOGRAM (N63.20) Impression: Left breast 0500 4 cmfn - 9 x 4 x 8 mm complex sclerosing lesion with a focus of atypia  Current Plans Schedule for Surgery - Left radioactive seed  localized lumpectomy. The surgical procedure has been discussed with the patient. Potential risks, benefits, alternative treatments, and expected outcomes have been explained. All of the patient's questions at this time have been answered. The likelihood of reaching the patient's treatment goal is good. The patient understand the proposed surgical procedure  and wishes to proceed.   Imogene Burn. Georgette Dover, MD, [facility]  General/ Trauma Surgery   07/12/2019 7:12 AM

## 2019-07-12 NOTE — Transfer of Care (Signed)
Immediate Anesthesia Transfer of Care Note  Patient: Holly Schroeder  Procedure(s) Performed: LEFT BREAST LUMPECTOMY WITH RADIOACTIVE SEED LOCALIZATION (Left Breast)  Patient Location: PACU  Anesthesia Type:General  Level of Consciousness: sedated  Airway & Oxygen Therapy: Patient Spontanous Breathing and Patient connected to nasal cannula oxygen  Post-op Assessment: Report given to RN and Post -op Vital signs reviewed and stable  Post vital signs: Reviewed and stable  Last Vitals:  Vitals Value Taken Time  BP 125/70 07/12/19 0823  Temp    Pulse 116 07/12/19 0826  Resp 13 07/12/19 0826  SpO2 100 % 07/12/19 0826  Vitals shown include unvalidated device data.  Last Pain:  Vitals:   07/12/19 0649  TempSrc: Tympanic  PainSc: 0-No pain         Complications: No apparent anesthesia complications

## 2019-07-14 LAB — SURGICAL PATHOLOGY

## 2020-02-28 ENCOUNTER — Encounter (HOSPITAL_COMMUNITY): Payer: Self-pay

## 2021-06-02 IMAGING — MG MM BREAST LOCALIZATION CLIP
4 series · 4 of 12 positions shown · non-contrast
Comparison: Previous exam(s).

CLINICAL DATA: Patient underwent ultrasound-guided biopsy of a left
breast mass.

EXAM:
DIAGNOSTIC LEFT MAMMOGRAM POST ULTRASOUND BIOPSY

[L ML synth-2D]
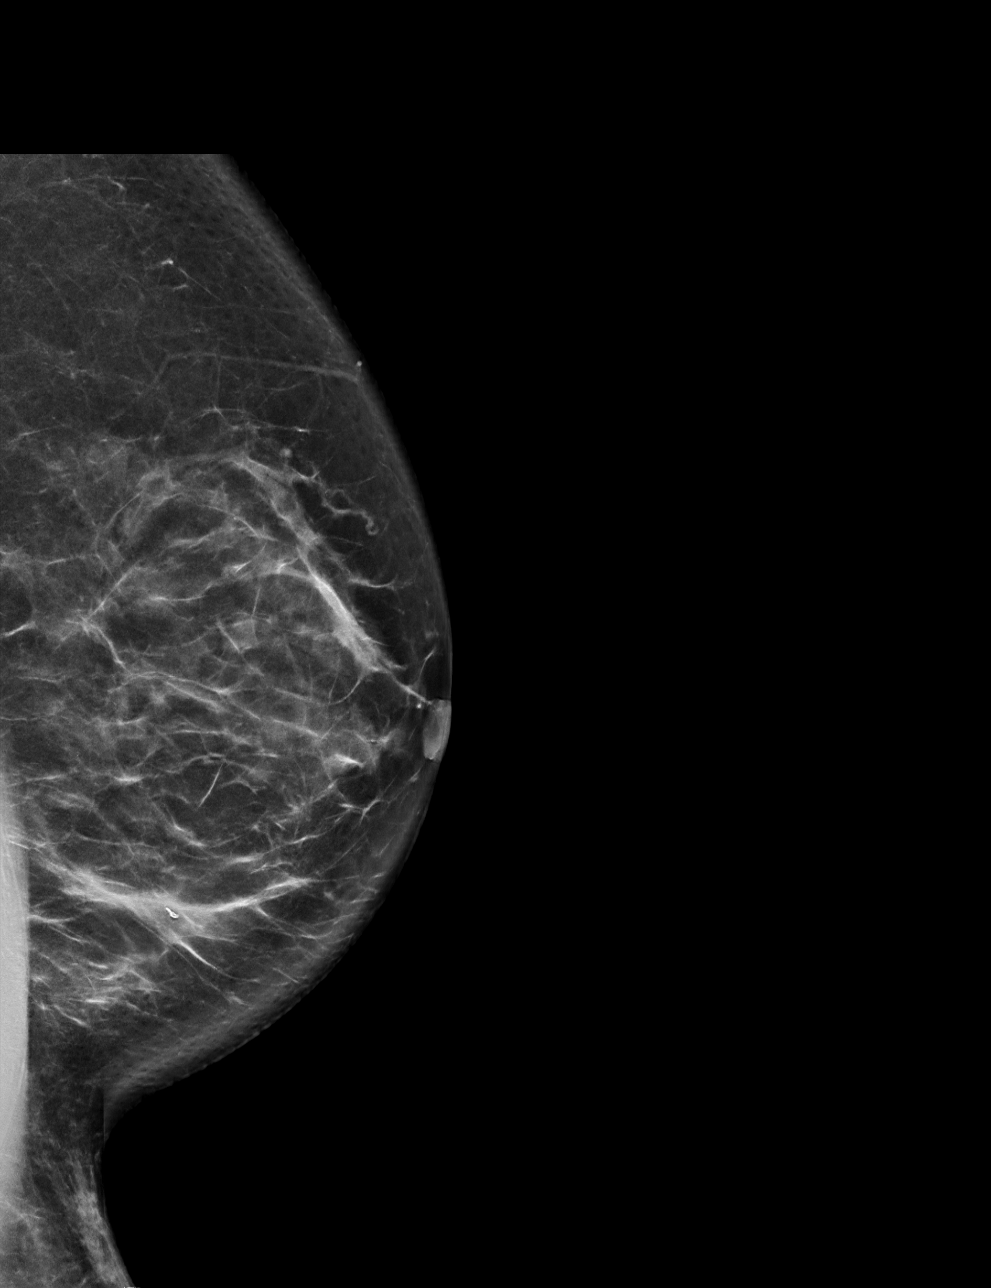

[L CC synth-2D]
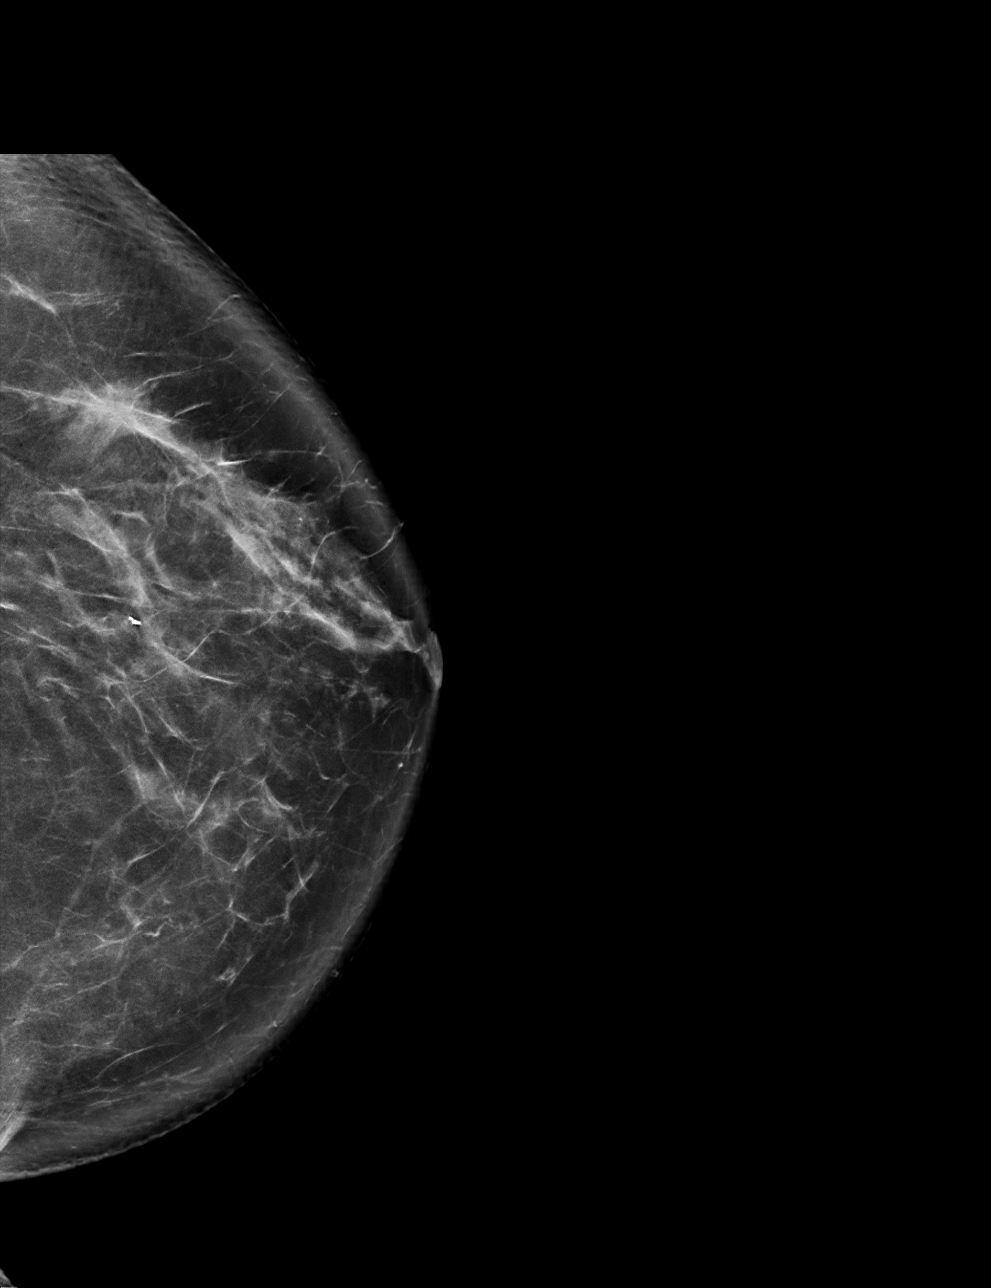

[L CC tomo · tomo slice 43/84.0]
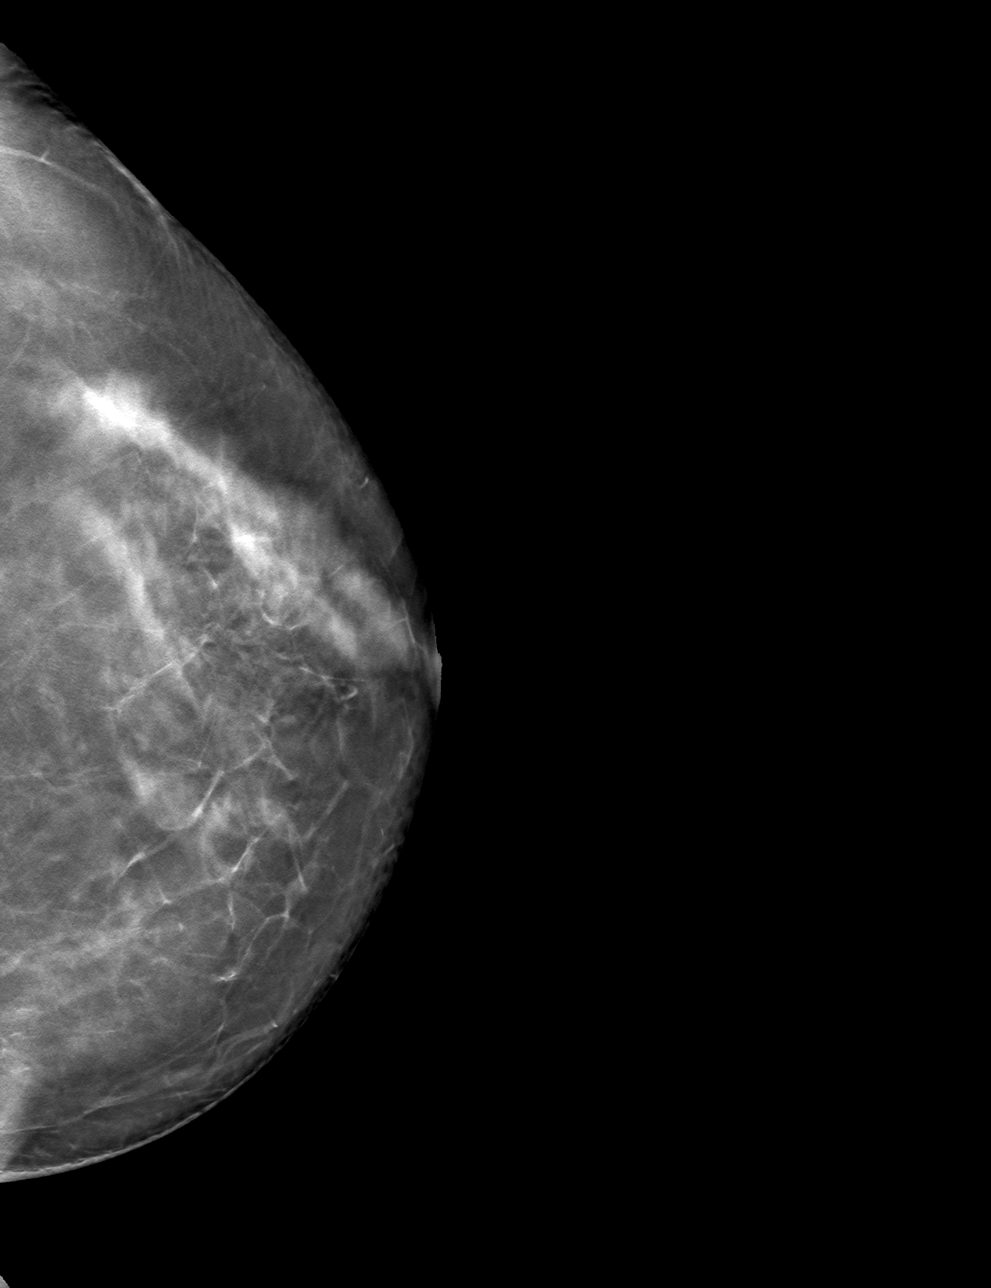

[L ML tomo · tomo slice 39/76.0]
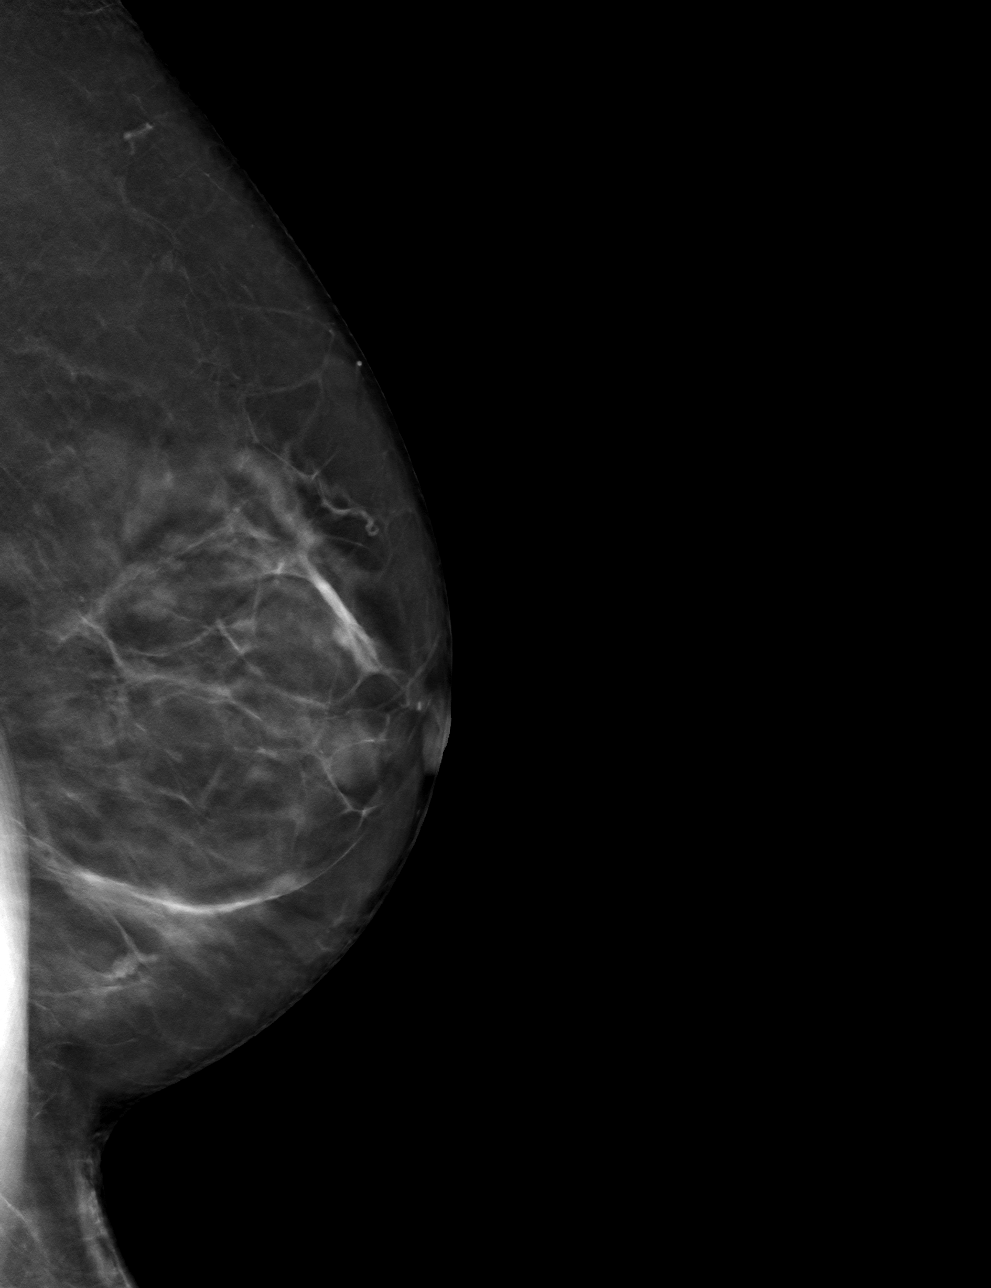

[4 of 12 positions shown; findings below may reference images not displayed]

FINDINGS: Mammographic images were obtained following ultrasound guided biopsy
of a left breast mass at 5 o'clock. The biopsy marking clip is in
expected position at the site of biopsy.
IMPRESSION: Appropriate positioning of the ribbon shaped biopsy marking clip at
the site of biopsy in the left breast 5 o'clock.

Final Assessment: Post Procedure Mammograms for Marker Placement

## 2021-07-26 IMAGING — DX MM BREAST SURGICAL SPECIMEN
1 series · 2 of 2 positions shown · non-contrast
Comparison: Previous exam(s).

CLINICAL DATA: Post left breast excision for a complex sclerosing
lesion.

EXAM:
SPECIMEN RADIOGRAPH OF THE LEFT BREAST

[Series 2: specimen digital x-ray, derived · left · 0.10mm/px · 2 of 2 slices shown]
[im 1/2]
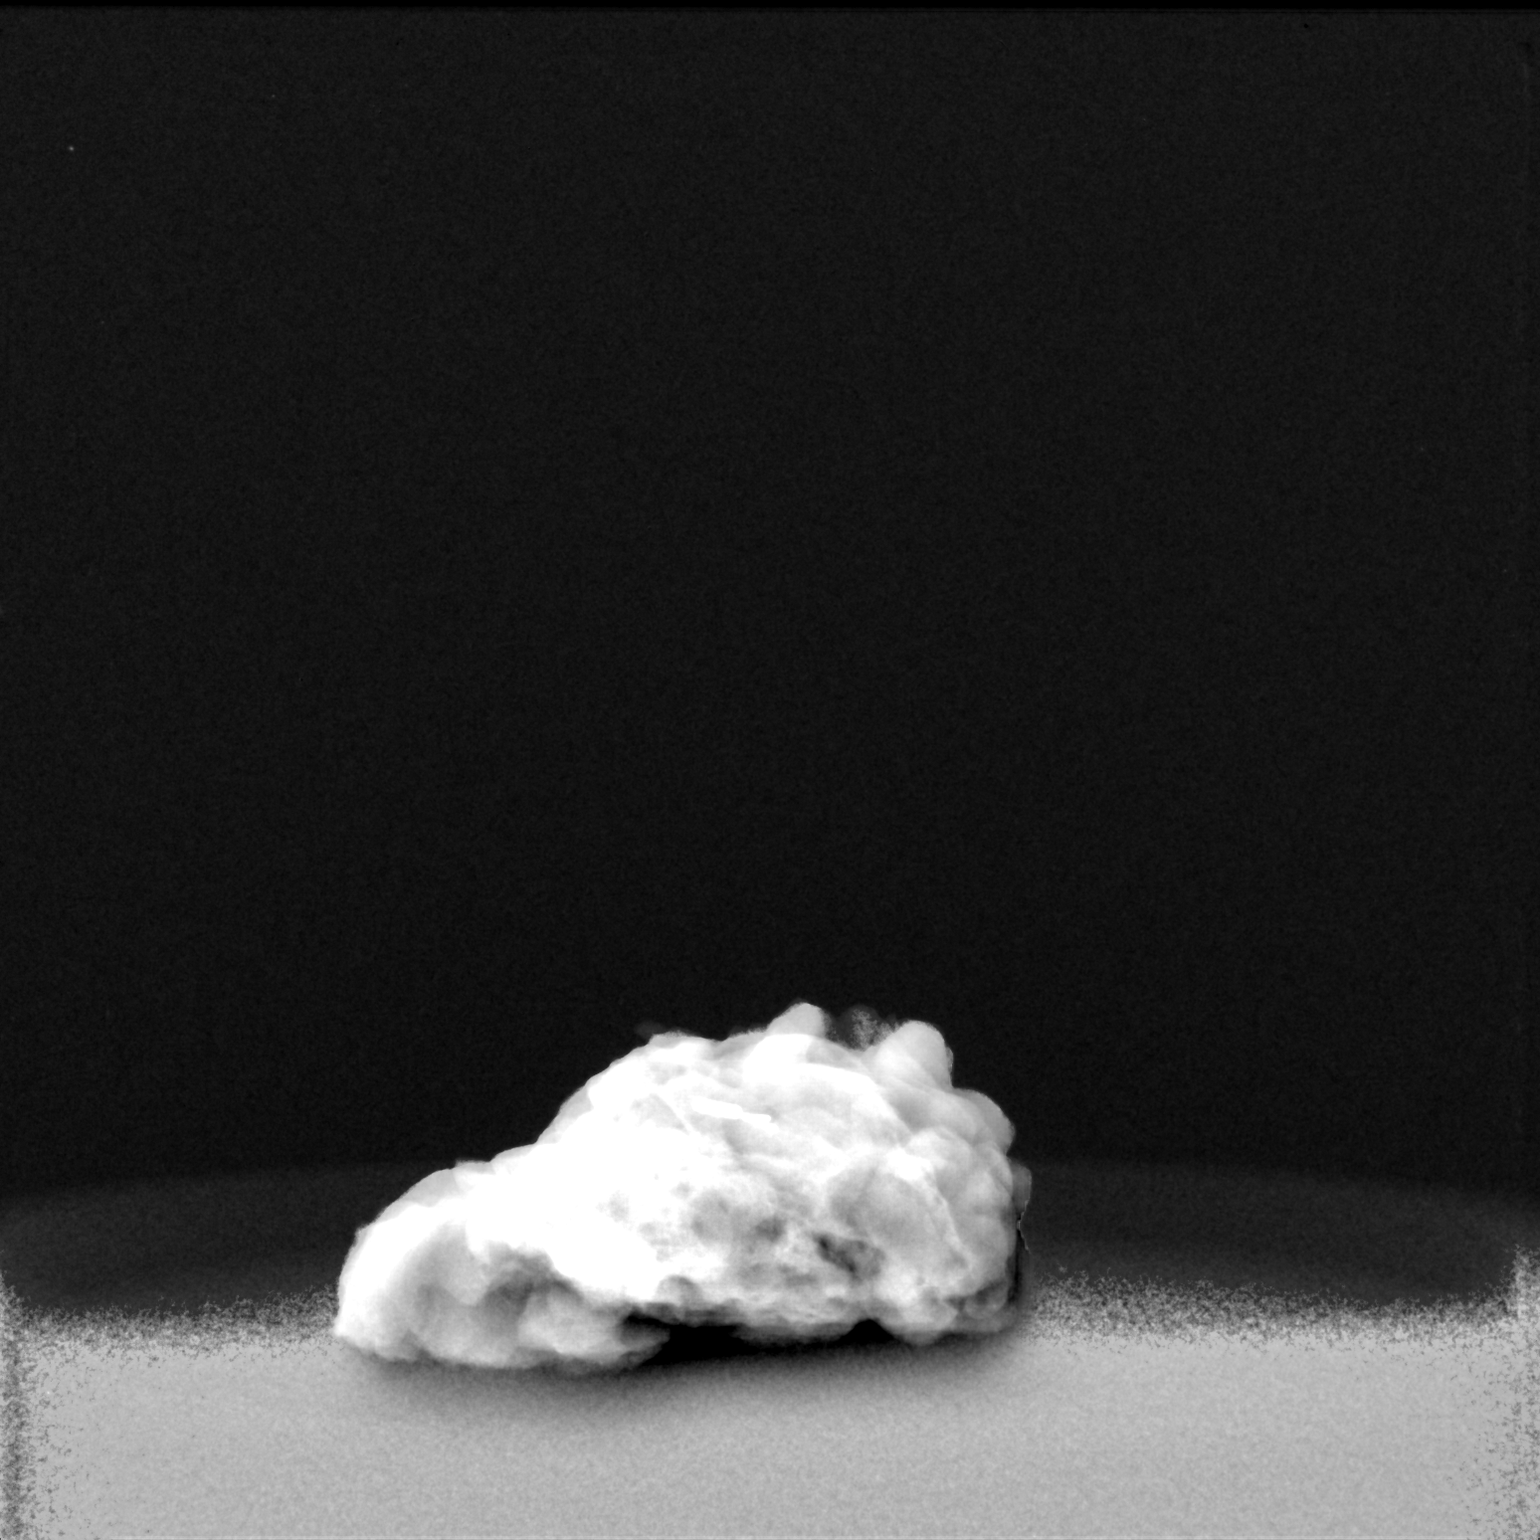
[im 2/2]
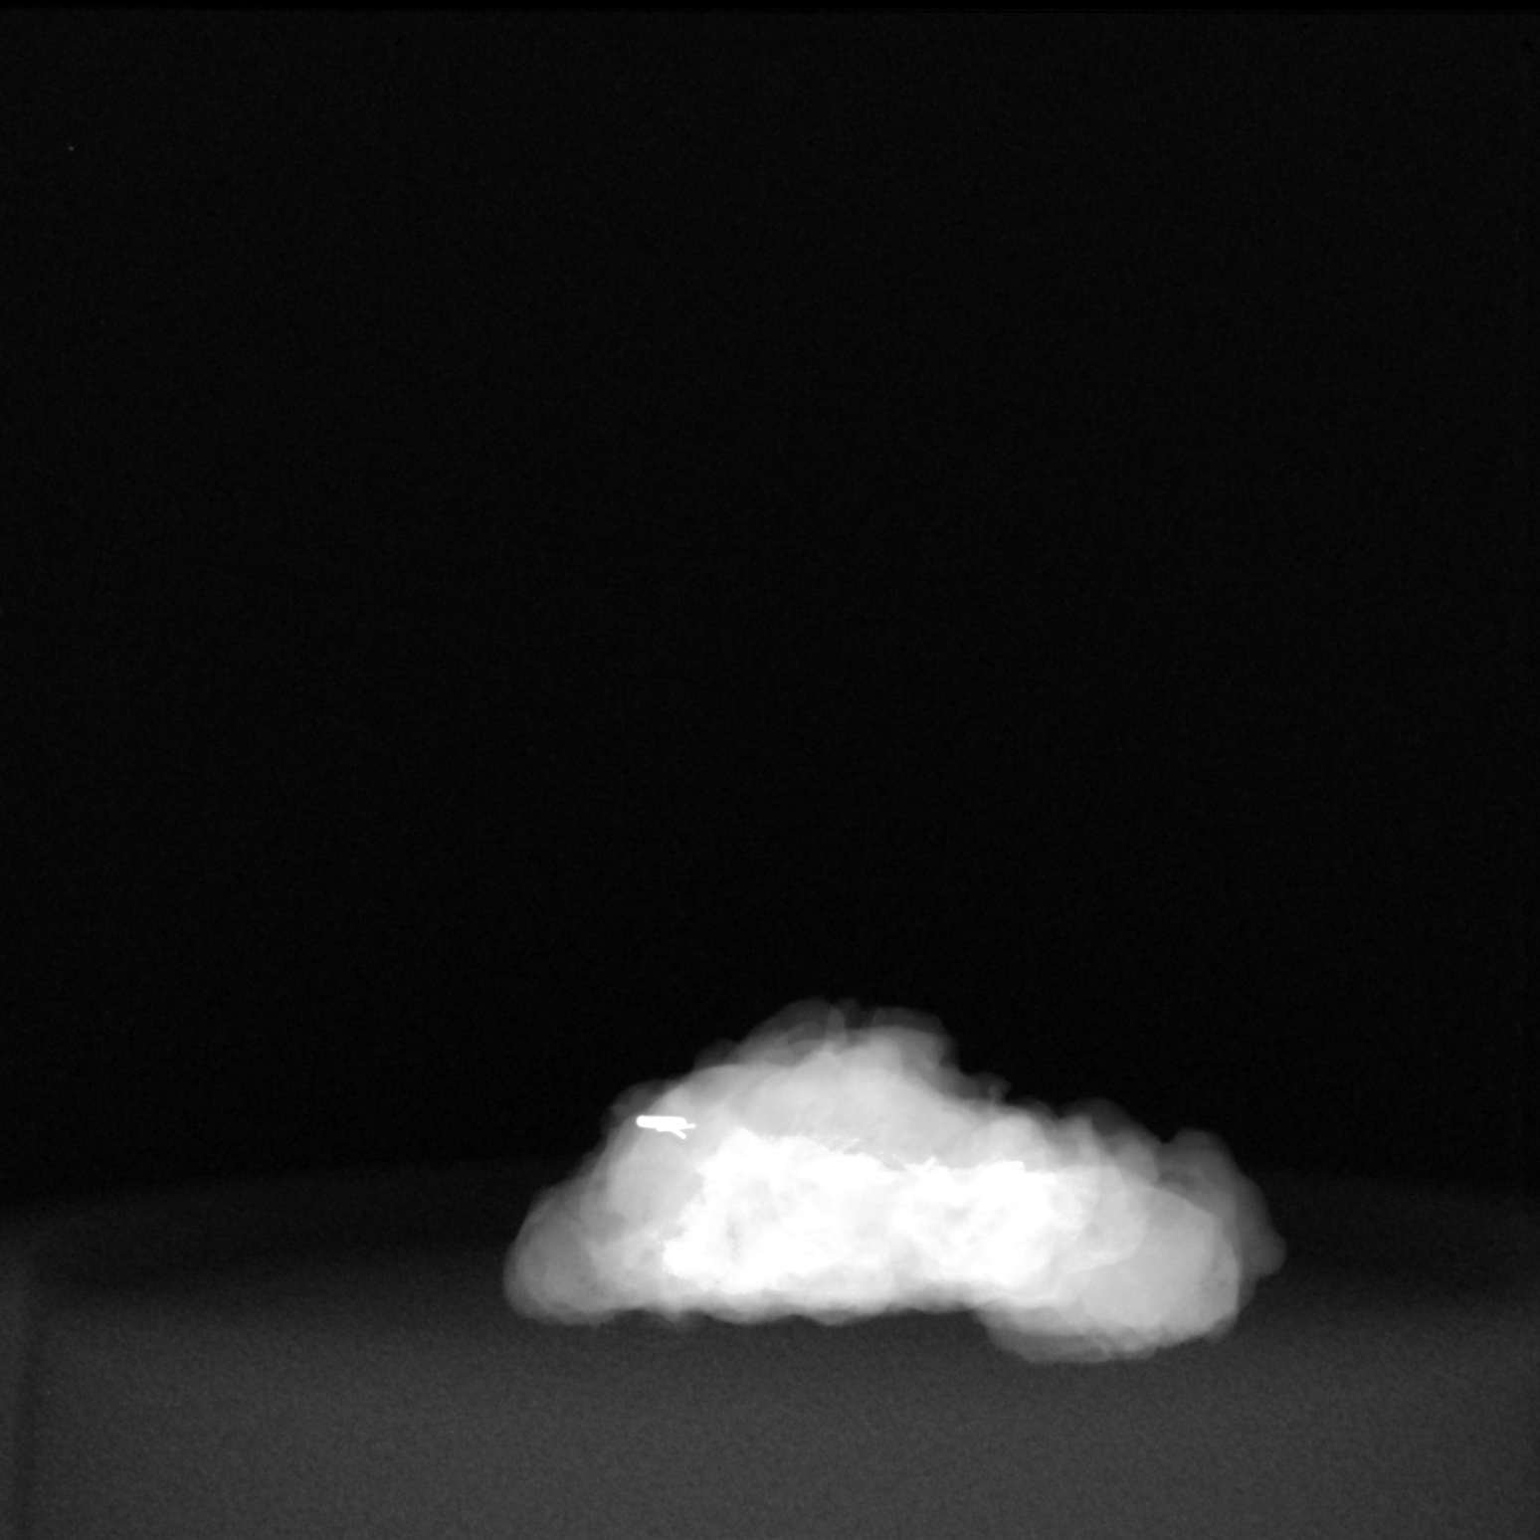

[2 of 2 positions shown; findings below may reference images not displayed]

FINDINGS: Status post excision of the left breast. The radioactive seed and
ribbon shaped biopsy marker clip are present, completely intact, and
were marked for pathology.
IMPRESSION: Specimen radiograph of the left breast.

## 2022-06-09 ENCOUNTER — Other Ambulatory Visit: Payer: Self-pay | Admitting: Obstetrics and Gynecology

## 2022-06-09 DIAGNOSIS — R928 Other abnormal and inconclusive findings on diagnostic imaging of breast: Secondary | ICD-10-CM

## 2022-06-18 ENCOUNTER — Ambulatory Visit
Admission: RE | Admit: 2022-06-18 | Discharge: 2022-06-18 | Discharge status: Home / Self Care | Payer: Medicare Other | Source: Ambulatory Visit | Attending: Obstetrics and Gynecology | Admitting: Obstetrics and Gynecology

## 2022-06-18 DIAGNOSIS — R928 Other abnormal and inconclusive findings on diagnostic imaging of breast: Secondary | ICD-10-CM

## 2022-06-23 ENCOUNTER — Other Ambulatory Visit: Payer: Self-pay | Admitting: Obstetrics and Gynecology

## 2022-06-23 DIAGNOSIS — R921 Mammographic calcification found on diagnostic imaging of breast: Secondary | ICD-10-CM

## 2022-12-29 ENCOUNTER — Ambulatory Visit
Admission: RE | Admit: 2022-12-29 | Discharge: 2022-12-29 | Disposition: A | Discharge status: Home / Self Care | Payer: Medicare Other | Source: Ambulatory Visit | Attending: Obstetrics and Gynecology | Admitting: Obstetrics and Gynecology

## 2022-12-29 DIAGNOSIS — R921 Mammographic calcification found on diagnostic imaging of breast: Secondary | ICD-10-CM

## 2023-03-26 LAB — HM DEXA SCAN

## 2024-03-09 NOTE — Progress Notes (Unsigned)
   LILLETTE Ileana Collet, PhD, LAT, ATC acting as a scribe for Artist Lloyd, MD.  Holly Schroeder is a 73 y.o. female who presents to [facility] today for osteoporosis management.  DEXA scan (date, T-score): 03/26/23: Spine= 0.2, L-FN= -1.8, R-FN= -2.4 Prior treatment: Prolia (d/c do to dental implant)- Sept 2024 History of Hip, Spine, or Wrist Fx: no Heart disease or stroke: no Cancer: no Kidney Disease: no Gastric/Peptic Ulcer: yes Gastric bypass surgery: no Severe GERD: no Hx of seizures: no Age at Menopause: 97-51 y/o Calcium intake: yes, 500mg  Vitamin D intake: yes, 1000iU Hormone replacement therapy: no Smoking history: never smoked Alcohol: 4-5 per wk Exercise: weight training x 3/wk, outdoor activity Major dental work in past year: yes- implant 1 year ago Parents with hip/spine fracture: no Height loss: none   Pertinent review of systems: No fevers or chills  Relevant historical information: Osteoporosis History of recent dental implants.  Has been on Prolia for about 7 years but the off the last 1 year.   Exam:  BP 130/88   Pulse 74   Ht 5' (1.524 m)   Wt 124 lb (56.2 kg)   SpO2 100%   BMI 24.22 kg/m  General: Well Developed, well nourished, and in no acute distress.   MSK: Normal gait and motion       Assessment and Plan: 73 y.o. female with osteoporosis.  Patient has had a 7-year course of Prolia but over the last year has been not taking any osteoporosis medication as she has been dealing with a dental bone graft and implant over the last year.  We talked about options.  She already has done a good job of optimizing conservative management with weightbearing exercise calcium and vitamin D.  Will go ahead and check basic labs listed below.  As for treatment I do not think it makes sense to resume Prolia.  She is already had 7 years of it and it is hard to know how long to continue Prolia beyond 7 years.  We talked about  options.  Plan for Reclast infusion series starting now for approximately 5 years.  Patient will be due for another bone density test in about 1 year.   PDMP not reviewed this encounter. Orders Placed This Encounter  Procedures   Comprehensive metabolic panel with GFR    Osteoporis    Standing Status:   Future    Number of Occurrences:   1    Expiration Date:   03/11/2025   Magnesium    Therapeutic drug monitoring    Standing Status:   Future    Number of Occurrences:   1    Expiration Date:   03/11/2025   VITAMIN D 25 Hydroxy (Vit-D Deficiency, Fractures)    Osteoporis    Standing Status:   Future    Number of Occurrences:   1    Expiration Date:   03/11/2025   Phosphorus    Osteroporis    Standing Status:   Future    Number of Occurrences:   1    Expiration Date:   03/11/2025   No orders of the defined types were placed in this encounter.    Discussed warning signs or symptoms. Please see discharge instructions. Patient expresses understanding.   The above documentation has been reviewed and is accurate and complete Artist Lloyd, M.D.

## 2024-03-11 ENCOUNTER — Ambulatory Visit (INDEPENDENT_AMBULATORY_CARE_PROVIDER_SITE_OTHER): Admitting: Family Medicine

## 2024-03-11 ENCOUNTER — Telehealth: Payer: Self-pay | Admitting: Pharmacy Technician

## 2024-03-11 ENCOUNTER — Telehealth: Payer: Self-pay

## 2024-03-11 VITALS — BP 130/88 | HR 74 | Ht 60.0 in | Wt 124.0 lb

## 2024-03-11 DIAGNOSIS — M810 Age-related osteoporosis without current pathological fracture: Secondary | ICD-10-CM

## 2024-03-11 LAB — COMPREHENSIVE METABOLIC PANEL WITH GFR
ALT: 22 U/L (ref 0–35)
AST: 25 U/L (ref 0–37)
Albumin: 4.5 g/dL (ref 3.5–5.2)
Alkaline Phosphatase: 28 U/L — ABNORMAL LOW (ref 39–117)
BUN: 17 mg/dL (ref 6–23)
CO2: 28 meq/L (ref 19–32)
Calcium: 9.9 mg/dL (ref 8.4–10.5)
Chloride: 101 meq/L (ref 96–112)
Creatinine, Ser: 0.84 mg/dL (ref 0.40–1.20)
GFR: 68.38 mL/min (ref >60.00)
Glucose, Bld: 84 mg/dL (ref 70–99)
Potassium: 4.6 meq/L (ref 3.5–5.1)
Sodium: 139 meq/L (ref 135–145)
Total Bilirubin: 0.3 mg/dL (ref 0.2–1.2)
Total Protein: 7.5 g/dL (ref 6.0–8.3)

## 2024-03-11 LAB — PHOSPHORUS: Phosphorus: 5.1 mg/dL — ABNORMAL HIGH (ref 2.3–4.6)

## 2024-03-11 LAB — MAGNESIUM: Magnesium: 2.1 mg/dL (ref 1.5–2.5)

## 2024-03-11 LAB — VITAMIN D 25 HYDROXY (VIT D DEFICIENCY, FRACTURES): VITD: 52.64 ng/mL (ref 30.00–100.00)

## 2024-03-11 NOTE — Telephone Encounter (Signed)
 Order placed for Reclast with [facility].

## 2024-03-11 NOTE — Telephone Encounter (Addendum)
 Auth Submission: NO AUTH NEEDED Site of care: Site of care: [facility] Payer: MEDICARE A/B & [employer] SUPP Medication & CPT/J Code(s) submitted: Reclast  (Zolendronic acid) J3489 Diagnosis Code:  Route of submission (phone, fax, portal):  Phone # Fax # Auth type: Buy/Bill PB Units/visits requested: X1 DOSE Reference number:  Approval from: 03/11/24 to 05/11/25

## 2024-03-11 NOTE — Patient Instructions (Addendum)
 Thank you for coming in today.   Continue weight bearing exercise  Reclast ordered in [facility]  Stop  by lab before you go.   See you back in 1 year

## 2024-03-12 ENCOUNTER — Encounter: Payer: Self-pay | Admitting: Family Medicine

## 2024-03-12 DIAGNOSIS — M810 Age-related osteoporosis without current pathological fracture: Secondary | ICD-10-CM

## 2024-03-12 DIAGNOSIS — R7989 Other specified abnormal findings of blood chemistry: Secondary | ICD-10-CM

## 2024-03-14 NOTE — Telephone Encounter (Signed)
 Forwarding to Dr. Denyse Amass to review and advise.

## 2024-03-15 ENCOUNTER — Ambulatory Visit: Payer: Self-pay | Admitting: Family Medicine

## 2024-03-15 NOTE — Progress Notes (Signed)
 Labs look okay.  Phosphorus is a bit elevated.  Vitamin D looks okay.

## 2024-03-16 ENCOUNTER — Other Ambulatory Visit

## 2024-03-16 DIAGNOSIS — M810 Age-related osteoporosis without current pathological fracture: Secondary | ICD-10-CM | POA: Diagnosis not present

## 2024-03-16 DIAGNOSIS — R7989 Other specified abnormal findings of blood chemistry: Secondary | ICD-10-CM

## 2024-03-17 ENCOUNTER — Ambulatory Visit

## 2024-03-18 LAB — PTH, INTACT AND CALCIUM
Calcium: 9.5 mg/dL (ref 8.6–10.4)
PTH: 21 pg/mL (ref 16–77)

## 2024-03-21 ENCOUNTER — Ambulatory Visit: Payer: Self-pay | Admitting: Family Medicine

## 2024-03-21 NOTE — Telephone Encounter (Signed)
 I think it is okay to proceed with the infusion.  Please let me know what you find out

## 2024-03-21 NOTE — Progress Notes (Signed)
 Parathyroid hormone looks normal.

## 2024-03-23 NOTE — Telephone Encounter (Signed)
 Reclast infusion scheduled 04/28/24  Last OP visit 03/11/24 OP F/U visit due 03/11/25

## 2024-04-28 ENCOUNTER — Ambulatory Visit

## 2024-05-19 ENCOUNTER — Ambulatory Visit (INDEPENDENT_AMBULATORY_CARE_PROVIDER_SITE_OTHER)

## 2024-05-19 ENCOUNTER — Encounter: Payer: Self-pay | Admitting: Family Medicine

## 2024-05-19 VITALS — BP 131/80 | HR 72 | Temp 97.4°F | Resp 14 | Ht 60.0 in | Wt 128.4 lb

## 2024-05-19 DIAGNOSIS — M810 Age-related osteoporosis without current pathological fracture: Secondary | ICD-10-CM

## 2024-05-19 MED ORDER — ZOLEDRONIC ACID 5 MG/100ML IV SOLN
5.0000 mg | Freq: Once | INTRAVENOUS | Status: AC
  Administered 2024-05-19: 5 mg via INTRAVENOUS
  Filled 2024-05-19: qty 100

## 2024-05-19 MED ORDER — ACETAMINOPHEN 325 MG PO TABS
650.0000 mg | ORAL_TABLET | Freq: Once | ORAL | Status: AC
  Administered 2024-05-19: 650 mg via ORAL
  Filled 2024-05-19: qty 2

## 2024-05-19 MED ORDER — SODIUM CHLORIDE 0.9 % IV SOLN: INTRAVENOUS | Status: DC

## 2024-05-19 MED ORDER — DIPHENHYDRAMINE HCL 25 MG PO CAPS
25.0000 mg | ORAL_CAPSULE | Freq: Once | ORAL | Status: AC
  Administered 2024-05-19: 25 mg via ORAL
  Filled 2024-05-19: qty 1

## 2024-05-19 NOTE — Patient Instructions (Signed)

## 2024-05-19 NOTE — Progress Notes (Signed)
 Diagnosis: Osteoporosis  Provider:  Lonna Coder MD  Procedure: IV Infusion  IV Type: Peripheral, IV Location: R Forearm  Reclast  (Zolendronic Acid), Dose: 5 mg  Infusion Start Time: 1348  Infusion Stop Time: 1420  Post Infusion IV Care: Observation period completed and Peripheral IV Discontinued  Discharge: Condition: Good, Destination: Home . AVS Provided  Performed by:  Leza Apsey, RN
